# Patient Record
Sex: Male | Born: 1955 | Race: White | Hispanic: No | Smoking: Current every day smoker
Health system: Southern US, Community
[De-identification: ages and names within clinical notes are randomized; demographics above are authoritative.]

## PROBLEM LIST (undated history)

## (undated) DIAGNOSIS — E7800 Pure hypercholesterolemia, unspecified: Secondary | ICD-10-CM

## (undated) DIAGNOSIS — N189 Chronic kidney disease, unspecified: Secondary | ICD-10-CM

## (undated) DIAGNOSIS — N289 Disorder of kidney and ureter, unspecified: Secondary | ICD-10-CM

## (undated) DIAGNOSIS — I10 Essential (primary) hypertension: Secondary | ICD-10-CM

## (undated) DIAGNOSIS — E119 Type 2 diabetes mellitus without complications: Secondary | ICD-10-CM

## (undated) DIAGNOSIS — C801 Malignant (primary) neoplasm, unspecified: Secondary | ICD-10-CM

## (undated) HISTORY — PX: BIOPSY PHARYNX: SUR141

## (undated) HISTORY — PX: CATARACT EXTRACTION, BILATERAL: SHX1313

## (undated) HISTORY — PX: CHOLECYSTECTOMY: SHX55

---

## 2015-11-25 ENCOUNTER — Emergency Department (HOSPITAL_COMMUNITY)
Admission: EM | Admit: 2015-11-25 | Discharge: 2015-11-25 | Disposition: A | Discharge status: Home / Self Care | Payer: Medicaid Other | Attending: Emergency Medicine | Admitting: Emergency Medicine

## 2015-11-25 ENCOUNTER — Encounter (HOSPITAL_COMMUNITY): Payer: Self-pay | Admitting: Emergency Medicine

## 2015-11-25 ENCOUNTER — Emergency Department (HOSPITAL_COMMUNITY): Payer: Medicaid Other

## 2015-11-25 DIAGNOSIS — S161XXA Strain of muscle, fascia and tendon at neck level, initial encounter: Secondary | ICD-10-CM | POA: Insufficient documentation

## 2015-11-25 DIAGNOSIS — F17210 Nicotine dependence, cigarettes, uncomplicated: Secondary | ICD-10-CM | POA: Insufficient documentation

## 2015-11-25 DIAGNOSIS — Y999 Unspecified external cause status: Secondary | ICD-10-CM | POA: Diagnosis not present

## 2015-11-25 DIAGNOSIS — Z791 Long term (current) use of non-steroidal anti-inflammatories (NSAID): Secondary | ICD-10-CM | POA: Diagnosis not present

## 2015-11-25 DIAGNOSIS — Z79899 Other long term (current) drug therapy: Secondary | ICD-10-CM | POA: Insufficient documentation

## 2015-11-25 DIAGNOSIS — E119 Type 2 diabetes mellitus without complications: Secondary | ICD-10-CM | POA: Insufficient documentation

## 2015-11-25 DIAGNOSIS — I10 Essential (primary) hypertension: Secondary | ICD-10-CM | POA: Diagnosis not present

## 2015-11-25 DIAGNOSIS — S199XXA Unspecified injury of neck, initial encounter: Secondary | ICD-10-CM | POA: Diagnosis present

## 2015-11-25 DIAGNOSIS — Y92410 Unspecified street and highway as the place of occurrence of the external cause: Secondary | ICD-10-CM | POA: Diagnosis not present

## 2015-11-25 DIAGNOSIS — Y9389 Activity, other specified: Secondary | ICD-10-CM | POA: Diagnosis not present

## 2015-11-25 HISTORY — DX: Type 2 diabetes mellitus without complications: E11.9

## 2015-11-25 HISTORY — DX: Malignant (primary) neoplasm, unspecified: C80.1

## 2015-11-25 HISTORY — DX: Essential (primary) hypertension: I10

## 2015-11-25 MED ORDER — METHOCARBAMOL 500 MG PO TABS: 500.0000 mg | ORAL_TABLET | Freq: Three times a day (TID) | ORAL | 0 refills | Status: AC

## 2015-11-25 MED ORDER — IBUPROFEN 800 MG PO TABS
800.0000 mg | ORAL_TABLET | Freq: Once | ORAL | Status: AC
  Administered 2015-11-25: 800 mg via ORAL
  Filled 2015-11-25: qty 1

## 2015-11-25 MED ORDER — METHOCARBAMOL 500 MG PO TABS
500.0000 mg | ORAL_TABLET | Freq: Once | ORAL | Status: AC
  Administered 2015-11-25: 500 mg via ORAL
  Filled 2015-11-25: qty 1

## 2015-11-25 MED ORDER — IBUPROFEN 800 MG PO TABS: 800.0000 mg | ORAL_TABLET | Freq: Three times a day (TID) | ORAL | 0 refills | Status: DC

## 2015-11-25 NOTE — ED Notes (Signed)
C-collar applied

## 2015-11-25 NOTE — Discharge Instructions (Signed)
Apply ice packs on/off to your neck and back.  Follow-up with your primary doctor or one the orthopedic doctor listed if not improving
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
## 2015-12-29 ENCOUNTER — Other Ambulatory Visit (HOSPITAL_COMMUNITY): Payer: Self-pay | Admitting: Orthopaedic Surgery

## 2015-12-29 DIAGNOSIS — M542 Cervicalgia: Secondary | ICD-10-CM

## 2016-01-04 ENCOUNTER — Ambulatory Visit (HOSPITAL_COMMUNITY)
Admission: RE | Admit: 2016-01-04 | Discharge: 2016-01-04 | Disposition: A | Discharge status: Home / Self Care | Payer: Medicaid Other | Source: Ambulatory Visit | Attending: Orthopaedic Surgery | Admitting: Orthopaedic Surgery

## 2016-01-04 DIAGNOSIS — M542 Cervicalgia: Secondary | ICD-10-CM | POA: Diagnosis present

## 2016-01-04 DIAGNOSIS — M47892 Other spondylosis, cervical region: Secondary | ICD-10-CM | POA: Insufficient documentation

## 2016-01-04 DIAGNOSIS — J323 Chronic sphenoidal sinusitis: Secondary | ICD-10-CM | POA: Insufficient documentation

## 2016-01-04 DIAGNOSIS — M5033 Other cervical disc degeneration, cervicothoracic region: Secondary | ICD-10-CM | POA: Insufficient documentation

## 2017-01-08 ENCOUNTER — Emergency Department (HOSPITAL_COMMUNITY)
Admission: EM | Admit: 2017-01-08 | Discharge: 2017-01-08 | Disposition: A | Discharge status: Home / Self Care | Payer: Medicare Other | Attending: Emergency Medicine | Admitting: Emergency Medicine

## 2017-01-08 ENCOUNTER — Encounter (HOSPITAL_COMMUNITY): Payer: Self-pay | Admitting: Emergency Medicine

## 2017-01-08 DIAGNOSIS — Y929 Unspecified place or not applicable: Secondary | ICD-10-CM | POA: Insufficient documentation

## 2017-01-08 DIAGNOSIS — F17210 Nicotine dependence, cigarettes, uncomplicated: Secondary | ICD-10-CM | POA: Insufficient documentation

## 2017-01-08 DIAGNOSIS — Z79899 Other long term (current) drug therapy: Secondary | ICD-10-CM | POA: Insufficient documentation

## 2017-01-08 DIAGNOSIS — Y998 Other external cause status: Secondary | ICD-10-CM | POA: Diagnosis not present

## 2017-01-08 DIAGNOSIS — S65514A Laceration of blood vessel of right ring finger, initial encounter: Secondary | ICD-10-CM | POA: Diagnosis present

## 2017-01-08 DIAGNOSIS — Y280XXA Contact with sharp glass, undetermined intent, initial encounter: Secondary | ICD-10-CM | POA: Diagnosis not present

## 2017-01-08 DIAGNOSIS — S61216A Laceration without foreign body of right little finger without damage to nail, initial encounter: Secondary | ICD-10-CM | POA: Diagnosis not present

## 2017-01-08 DIAGNOSIS — Y9389 Activity, other specified: Secondary | ICD-10-CM | POA: Insufficient documentation

## 2017-01-08 MED ORDER — LIDOCAINE HCL (PF) 1 % IJ SOLN
INTRAMUSCULAR | Status: AC
  Administered 2017-01-08: 13:00:00
  Filled 2017-01-08: qty 5

## 2017-01-08 MED ORDER — POVIDONE-IODINE 10 % EX SOLN
CUTANEOUS | Status: AC
  Administered 2017-01-08: 13:00:00
  Filled 2017-01-08: qty 15

## 2017-01-08 MED ORDER — IBUPROFEN 800 MG PO TABS: 800.0000 mg | ORAL_TABLET | Freq: Three times a day (TID) | ORAL | 0 refills | Status: AC | PRN

## 2017-01-08 NOTE — ED Provider Notes (Signed)
[facility] DEPT Provider Note   CSN: [id number] Arrival date & time: 01/08/17  0931     History   Chief Complaint Chief Complaint  Patient presents with  . Extremity Laceration    HPI Larry Pittman is a 61 y.o. male.  HPI   61 year old male presenting for evaluation of finger laceration.earlier today, patient was reaching for a class and accidentally cut his right pinky finger on the broken edge. He reported immediate bleeding. Complaining of mild 4 out of 10 sharp non radiating pain to the affected site. He is right-hand dominant up-to-date with tetanus. He denies any other injury and denies any associated numbness. No specific treatment tried.  Past Medical History:  Diagnosis Date  . Cancer ([place])    throat     There are no active problems to display for this patient.   Past Surgical History:  Procedure Laterality Date  . BIOPSY PHARYNX         Home Medications    Prior to Admission medications   Medication Sig Start Date End Date Taking? Authorizing Provider  ibuprofen (ADVIL,MOTRIN) 800 MG tablet Take 1 tablet (800 mg total) by mouth 3 (three) times daily. 11/25/15   Triplett, Tammy, PA-C  methocarbamol (ROBAXIN) 500 MG tablet Take 1 tablet (500 mg total) by mouth 3 (three) times daily. 11/25/15   Kem Parkinson, PA-C    Family History History reviewed. No pertinent family history.  Social History Social History  Substance Use Topics  . Smoking status: Current Every Day Smoker    Packs/day: 0.50    Types: Cigarettes  . Smokeless tobacco: Never Used  . Alcohol use No     Allergies   Patient has no known allergies.   Review of Systems Review of Systems  Constitutional: Negative for fever.  Skin: Positive for wound.  Neurological: Negative for numbness.     Physical Exam Updated Vital Signs BP (!) 142/78 (BP Location: Left Arm)   Pulse 95   Temp 98.4 F (36.9 C) (Oral)   Resp 18   Ht 5\' 6"  (1.676 m)   Wt 85.7 kg (189 lb)   SpO2  94%   BMI 30.51 kg/m   Physical Exam  Constitutional: He appears well-developed and well-nourished. No distress.  HENT:  Head: Atraumatic.  Eyes: Conjunctivae are normal.  Neck: Neck supple.  Musculoskeletal:  Right hand: Fifth digit, 1 cm superficial laceration noted across the volar aspects of the PIP with normal flexion extension. Sensation intact distally, brisk bleeding noted. Brisk cap refill.  Neurological: He is alert.  Skin: No rash noted.  Psychiatric: He has a normal mood and affect.  Nursing note and vitals reviewed.    ED Treatments / Results  Labs (all labs ordered are listed, but only abnormal results are displayed) Labs Reviewed - No data to display  EKG  EKG Interpretation None       Radiology No results found.  Procedures .Marland KitchenLaceration Repair Date/Time: 01/08/2017 1:10 PM Performed by: Domenic Moras Authorized by: Domenic Moras   Consent:    Consent obtained:  Verbal   Consent given by:  Patient   Risks discussed:  Poor cosmetic result and infection   Alternatives discussed:  No treatment and delayed treatment Anesthesia (see MAR for exact dosages):    Anesthesia method:  Nerve block   Block location:  Digital nerve block   Block needle gauge:  25 G   Block anesthetic:  Lidocaine 1% w/o epi   Block technique:  Digital nerve block  Block injection procedure:  Anatomic landmarks identified   Block outcome:  Anesthesia achieved Laceration details:    Location:  Finger   Finger location:  R small finger   Length (cm):  1   Depth (mm):  3 Repair type:    Repair type:  Simple Pre-procedure details:    Preparation:  Patient was prepped and draped in usual sterile fashion Exploration:    Hemostasis achieved with:  Direct pressure   Wound exploration: wound explored through full range of motion and entire depth of wound probed and visualized     Wound extent: vascular damage     Wound extent: no foreign bodies/material noted and no muscle damage  noted     Contaminated: no   Skin repair:    Repair method:  Sutures   Suture size:  5-0   Suture material:  Prolene   Suture technique:  Simple interrupted   Number of sutures:  3 Approximation:    Approximation:  Close   Vermilion border: well-aligned   Post-procedure details:    Dressing:  Non-adherent dressing   Patient tolerance of procedure:  Tolerated well, no immediate complications    (including critical care time)  Medications Ordered in ED Medications  povidone-iodine (BETADINE) 10 % external solution (not administered)  lidocaine (PF) (XYLOCAINE) 1 % injection (not administered)     Initial Impression / Assessment and Plan / ED Course  I have reviewed the triage vital signs and the nursing notes.  Pertinent labs & imaging results that were available during my care of the patient were reviewed by me and considered in my medical decision making (see chart for details).    BP (!) 142/78 (BP Location: Left Arm)   Pulse 95   Temp 98.4 F (36.9 C) (Oral)   Resp 18   Ht 5\' 6"  (1.676 m)   Wt 85.7 kg (189 lb)   SpO2 94%   BMI 30.51 kg/m    Final Clinical Impressions(s) / ED Diagnoses   Final diagnoses:  Laceration without foreign body of right little finger without damage to nail, initial encounter    New Prescriptions Current Discharge Medication List     Successful lac repair. Recommend sutures removal in 7 days.  Return sooner if notice sign of infection.   Domenic Moras, PA-C 01/08/17 1317    Nat Christen, MD 01/11/17 1150

## 2017-01-08 NOTE — ED Triage Notes (Signed)
PT c/o right hand 5th digit laceration this am from a broken drinking glass. Pressure dressing applied at this time to help control bleeding.

## 2017-01-15 ENCOUNTER — Emergency Department (HOSPITAL_COMMUNITY)
Admission: EM | Admit: 2017-01-15 | Discharge: 2017-01-15 | Disposition: A | Discharge status: Home / Self Care | Payer: Medicare Other | Attending: Emergency Medicine | Admitting: Emergency Medicine

## 2017-01-15 ENCOUNTER — Encounter (HOSPITAL_COMMUNITY): Payer: Self-pay

## 2017-01-15 DIAGNOSIS — Z79899 Other long term (current) drug therapy: Secondary | ICD-10-CM | POA: Diagnosis not present

## 2017-01-15 DIAGNOSIS — F17210 Nicotine dependence, cigarettes, uncomplicated: Secondary | ICD-10-CM | POA: Diagnosis not present

## 2017-01-15 DIAGNOSIS — S61216D Laceration without foreign body of right little finger without damage to nail, subsequent encounter: Secondary | ICD-10-CM | POA: Insufficient documentation

## 2017-01-15 DIAGNOSIS — Y33XXXD Other specified events, undetermined intent, subsequent encounter: Secondary | ICD-10-CM | POA: Diagnosis not present

## 2017-01-15 DIAGNOSIS — Z4802 Encounter for removal of sutures: Secondary | ICD-10-CM

## 2017-01-15 NOTE — ED Triage Notes (Signed)
Pt here for suture removal from r little finger.  Denies any complaints.

## 2017-01-15 NOTE — ED Provider Notes (Signed)
[facility] DEPT Provider Note   CSN: [id number] Arrival date & time: 01/15/17  0630     History   Chief Complaint Chief Complaint  Patient presents with  . Suture / Staple Removal    HPI Larry Pittman is a 61 y.o. male.  The history is provided by the patient.  Suture / Staple Removal  The current episode started more than 2 days ago. The problem has been gradually improving. Pertinent negatives include no chest pain, no abdominal pain, no headaches and no shortness of breath. Nothing aggravates the symptoms. Nothing relieves the symptoms. He has tried acetaminophen for the symptoms. The treatment provided significant relief.    Past Medical History:  Diagnosis Date  . Cancer ([place])    throat     There are no active problems to display for this patient.   Past Surgical History:  Procedure Laterality Date  . BIOPSY PHARYNX         Home Medications    Prior to Admission medications   Medication Sig Start Date End Date Taking? Authorizing Provider  ibuprofen (ADVIL,MOTRIN) 800 MG tablet Take 1 tablet (800 mg total) by mouth every 8 (eight) hours as needed for mild pain or moderate pain. 01/08/17   Domenic Moras, PA-C  methocarbamol (ROBAXIN) 500 MG tablet Take 1 tablet (500 mg total) by mouth 3 (three) times daily. 11/25/15   Triplett, Lynelle Smoke, PA-C    Family History No family history on file.  Social History Social History  Substance Use Topics  . Smoking status: Current Every Day Smoker    Packs/day: 0.50    Types: Cigarettes  . Smokeless tobacco: Never Used  . Alcohol use No     Allergies   Patient has no known allergies.   Review of Systems Review of Systems  Constitutional: Negative for activity change.       All ROS Neg except as noted in HPI  HENT: Negative for nosebleeds.   Eyes: Negative for photophobia and discharge.  Respiratory: Negative for cough, shortness of breath and wheezing.   Cardiovascular: Negative for chest pain and  palpitations.  Gastrointestinal: Negative for abdominal pain and blood in stool.  Genitourinary: Negative for dysuria, frequency and hematuria.  Musculoskeletal: Negative for arthralgias, back pain and neck pain.  Skin: Negative.   Neurological: Negative for dizziness, seizures, speech difficulty and headaches.  Psychiatric/Behavioral: Negative for confusion and hallucinations.     Physical Exam Updated Vital Signs BP (!) 148/89 (BP Location: Left Arm)   Pulse 83   Temp 98.1 F (36.7 C) (Oral)   Resp 18   Ht 5\' 6"  (1.676 m)   Wt 85.7 kg (189 lb)   SpO2 97%   BMI 30.51 kg/m   Physical Exam  Constitutional: He is oriented to person, place, and time. He appears well-developed and well-nourished.  Non-toxic appearance.  HENT:  Head: Normocephalic.  Right Ear: Tympanic membrane and external ear normal.  Left Ear: Tympanic membrane and external ear normal.  Eyes: Pupils are equal, round, and reactive to light. EOM and lids are normal.  Neck: Normal range of motion. Neck supple. Carotid bruit is not present.  Cardiovascular: Normal rate, regular rhythm, normal heart sounds, intact distal pulses and normal pulses.   Pulmonary/Chest: Breath sounds normal. No respiratory distress.  Abdominal: Soft. Bowel sounds are normal. There is no tenderness. There is no guarding.  Musculoskeletal: Normal range of motion.  Sutures in the right little finger intact. No signs of infection.   Lymphadenopathy:  Head (right side): No submandibular adenopathy present.       Head (left side): No submandibular adenopathy present.    He has no cervical adenopathy.  Neurological: He is alert and oriented to person, place, and time. He has normal strength. No cranial nerve deficit or sensory deficit.  No sensory/motor deficit of the right upper extremity  Skin: Skin is warm and dry.  Psychiatric: He has a normal mood and affect. His speech is normal.  Nursing note and vitals reviewed.    ED  Treatments / Results  Labs (all labs ordered are listed, but only abnormal results are displayed) Labs Reviewed - No data to display  EKG  EKG Interpretation None       Radiology No results found.  Procedures Procedures (including critical care time) Sutures removed by nursing staff. Pt has good ROm. No significant bleeding. No sensory deficit. Pt tolerated procedure without problem. Medications Ordered in ED Medications - No data to display   Initial Impression / Assessment and Plan / ED Course  I have reviewed the triage vital signs and the nursing notes.  Pertinent labs & imaging results that were available during my care of the patient were reviewed by me and considered in my medical decision making (see chart for details).       Final Clinical Impressions(s) / ED Diagnoses MDM No reported problem with infection or drainage from the suture site. No fever or chills. Sutures removed without problem. Pt will return to the ED if any changes or complications.   Final diagnoses:  Visit for suture removal    New Prescriptions New Prescriptions   No medications on file     Lily Kocher, Hershal Coria 01/15/17 9892    Nat Christen, MD 01/15/17 [phone]

## 2017-01-15 NOTE — Discharge Instructions (Signed)
Please see your MD or return to the ED if any changes or concern for infection.

## 2017-09-07 ENCOUNTER — Encounter (HOSPITAL_COMMUNITY): Payer: Self-pay

## 2017-09-07 ENCOUNTER — Emergency Department (HOSPITAL_COMMUNITY)
Admission: EM | Admit: 2017-09-07 | Discharge: 2017-09-07 | Disposition: A | Discharge status: Home / Self Care | Payer: Medicare Other | Attending: Emergency Medicine | Admitting: Emergency Medicine

## 2017-09-07 DIAGNOSIS — F17210 Nicotine dependence, cigarettes, uncomplicated: Secondary | ICD-10-CM | POA: Diagnosis not present

## 2017-09-07 DIAGNOSIS — M79644 Pain in right finger(s): Secondary | ICD-10-CM | POA: Diagnosis present

## 2017-09-07 DIAGNOSIS — W57XXXA Bitten or stung by nonvenomous insect and other nonvenomous arthropods, initial encounter: Secondary | ICD-10-CM | POA: Insufficient documentation

## 2017-09-07 NOTE — ED Triage Notes (Signed)
Pt reports right thumb pain since last pm. Finger swollen. Unsure if he has something stuck in it. Also removed a tick from right side last night and now area is red and swollen

## 2017-09-07 NOTE — Discharge Instructions (Addendum)
Soak your thumb in warm water 2-3 times a day.  Clean the area out with peroxide once daily.  Return to the ER for any redness increased swelling or increased pain to your thumb.  You may apply 1% hydrocortisone cream to the tick bite area 3 times a day.  Over-the-counter Benadryl if needed for itching.  Watch for fever, joint pain, rashes, flulike symptoms or increasing redness to the site of the tick bite.  If you see any other symptoms, follow-up with your primary doctor or return to the emergency room.

## 2017-09-07 NOTE — ED Provider Notes (Signed)
[facility] EMERGENCY DEPARTMENT Provider Note   CSN: [id number] Arrival date & time: 09/07/17  0744     History   Chief Complaint Chief Complaint  Patient presents with  . Hand Pain  . Tick Removal    HPI Colson Barco is a 62 y.o. male.  HPI   Fredderick Swanger is a 62 y.o. male who presents to the Emergency Department complaining of tick bite to his right chest wall.  He states the tick has been present for 2 days.  He removed it last night.  He comes to the emergency room for evaluation stating the area is now red and swollen.  He cleaned the area with hydrogen peroxide.  He denies rash or bleeding.  No fever, arthralgias or flulike symptoms.  He also request evaluation of pain to his right thumb since last evening.  He states that he may have a splinter or puncture wound to his thumb.  Pain is associated with palpation and movement.  He denies known injury.  TD is up-to-date.   Past Medical History:  Diagnosis Date  . Cancer ([place])    throat     There are no active problems to display for this patient.   Past Surgical History:  Procedure Laterality Date  . BIOPSY PHARYNX        Home Medications    Prior to Admission medications   Medication Sig Start Date End Date Taking? Authorizing Provider  ibuprofen (ADVIL,MOTRIN) 800 MG tablet Take 1 tablet (800 mg total) by mouth every 8 (eight) hours as needed for mild pain or moderate pain. 01/08/17   Domenic Moras, PA-C  methocarbamol (ROBAXIN) 500 MG tablet Take 1 tablet (500 mg total) by mouth 3 (three) times daily. 11/25/15   Gertrue Willette, Lynelle Smoke, PA-C    Family History No family history on file.  Social History Social History   Tobacco Use  . Smoking status: Current Every Day Smoker    Packs/day: 0.50    Types: Cigarettes  . Smokeless tobacco: Never Used  Substance Use Topics  . Alcohol use: No  . Drug use: No     Allergies   Patient has no known allergies.   Review of Systems Review of Systems    Constitutional: Negative for chills and fever.  Gastrointestinal: Negative for nausea and vomiting.  Musculoskeletal: Negative for joint swelling and myalgias.  Skin: Positive for wound. Negative for color change.       Tick bite right chest puncture, wound right thumb   Neurological: Negative for dizziness, weakness and numbness.  All other systems reviewed and are negative.    Physical Exam Updated Vital Signs BP (!) 157/78 (BP Location: Left Arm)   Pulse 71   Temp 97.7 F (36.5 C) (Oral)   Resp 15   Ht 5\' 6"  (1.676 m)   Wt 78.9 kg (174 lb)   SpO2 100%   BMI 28.08 kg/m   Physical Exam  Constitutional: He is oriented to person, place, and time. He appears well-developed and well-nourished. No distress.  HENT:  Head: Atraumatic.  Neck: Normal range of motion.  Cardiovascular: Normal rate, regular rhythm and intact distal pulses.  Pulmonary/Chest: Effort normal and breath sounds normal. No respiratory distress.  Musculoskeletal: Normal range of motion.  Small puncture to the distal fat pad area of the right thumb.  No erythema or significant edema.  Patient has full range of motion   Neurological: He is alert and oriented to person, place, and time.  Skin: Skin  is warm. Capillary refill takes less than 2 seconds.  Scabbed erythematous papule to the lateral right chest wall.  No surrounding erythema.  No remaining tick parts seen.  Nursing note and vitals reviewed.    ED Treatments / Results  Labs (all labs ordered are listed, but only abnormal results are displayed) Labs Reviewed - No data to display  EKG None  Radiology No results found.  Procedures Procedures (including critical care time)   Distal right thumb was cleaned with alcohol pads and puncture wound explored using an 18-gauge needle.  No foreign bodies seen or removed.  Medications Ordered in ED Medications - No data to display   Initial Impression / Assessment and Plan / ED Course  I have  reviewed the triage vital signs and the nursing notes.  Pertinent labs & imaging results that were available during my care of the patient were reviewed by me and considered in my medical decision making (see chart for details).     Localized tick bite reaction to the lower right chest wall.  Patient does not have other symptoms.  States tick was not engorged.  Discussed signs and symptoms of tick related illness, patient verbalized understanding and agrees to follow-up with his PCP or return to the ER if symptoms develop.  Final Clinical Impressions(s) / ED Diagnoses   Final diagnoses:  Tick bite, initial encounter  Pain of right thumb    ED Discharge Orders    None       Kem Parkinson, PA-C 09/07/17 0840    Francine Graven, DO 09/07/17 1125

## 2018-05-27 IMAGING — MR MR CERVICAL SPINE W/O CM
4 of 6 series · 14 of 48 positions shown · non-contrast
Comparison: Radiographs from 11/25/2015

CLINICAL DATA: Neck pain extending bilaterally through the
shoulders.

EXAM:
MRI CERVICAL SPINE WITHOUT CONTRAST
TECHNIQUE: Multiplanar, multisequence MR imaging of the cervical spine was
performed. No intravenous contrast was administered.

[Series 3: FLAIR · sagittal · 3.0mm · 0.46mm/px · 3 of 13 slices shown]
[im 1/13]
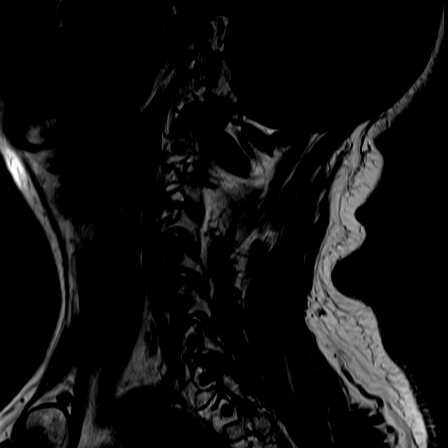
[im 7/13]
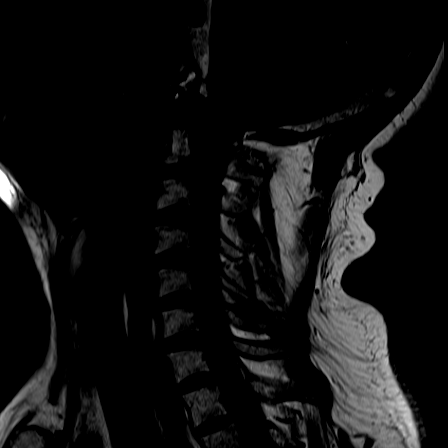
[im 13/13]
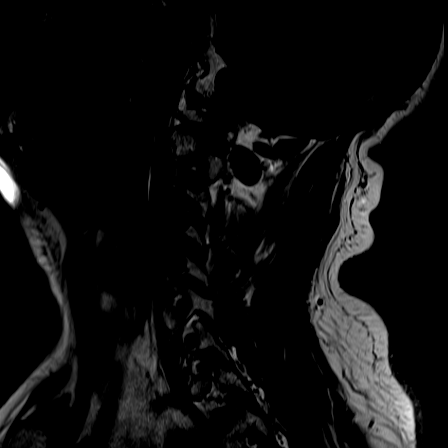

[Series 4: T2 · sagittal · 3.0mm · 0.43mm/px · 5 of 13 slices shown (1 of 2)]
[im 1/13]
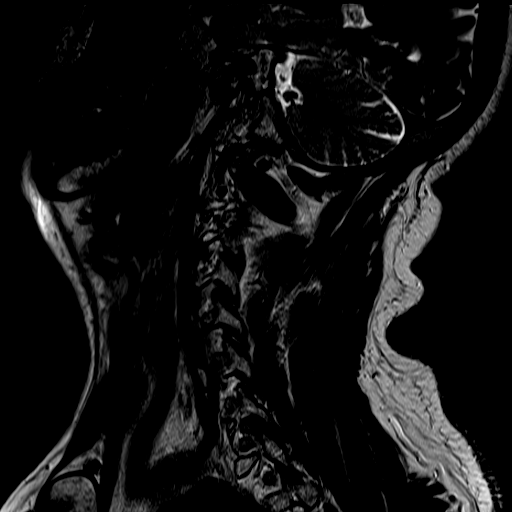
[im 4/13]
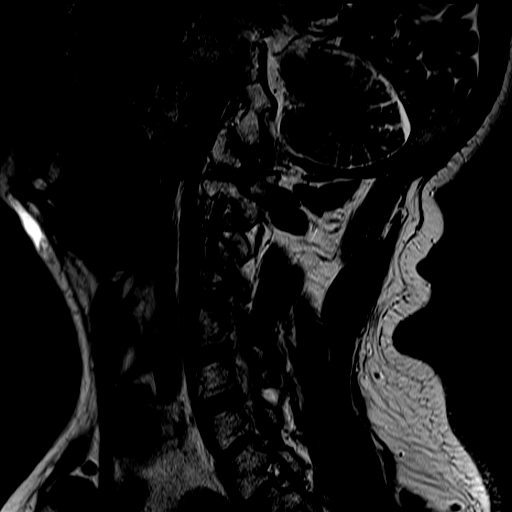
[im 7/13]
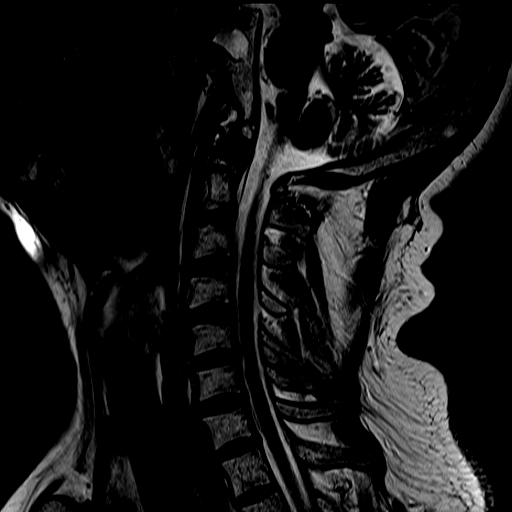
[im 10/13]
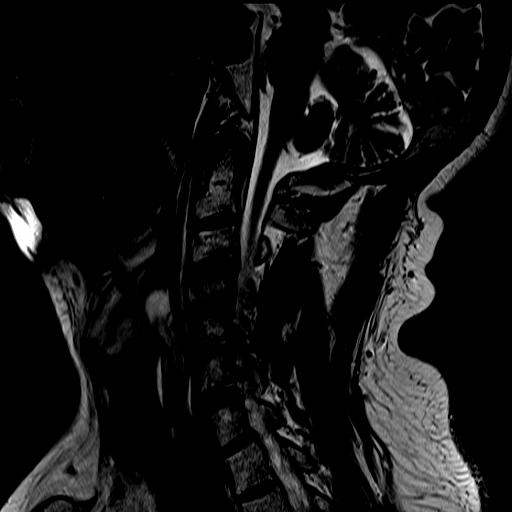
[im 13/13]
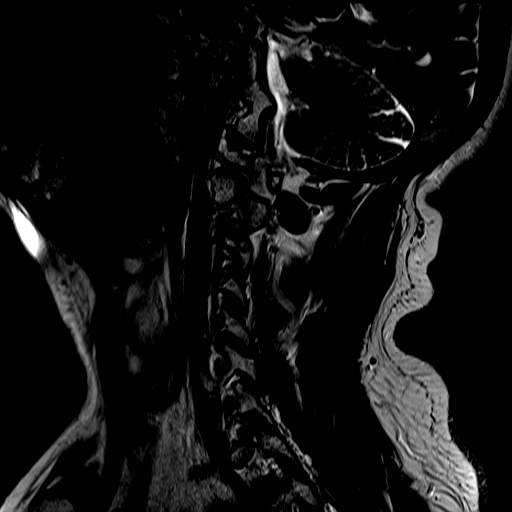

[Series 5: ir sagital · sagittal · 3.0mm · 0.25mm/px · 3 of 13 slices shown]
[im 1/13]
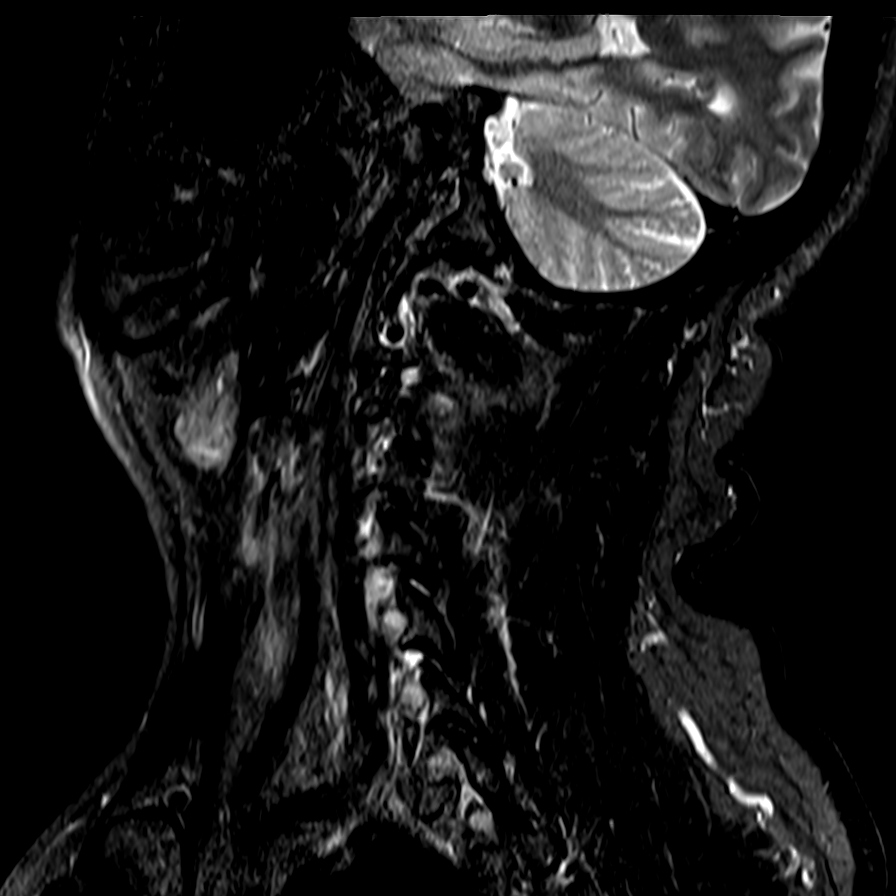
[im 7/13]
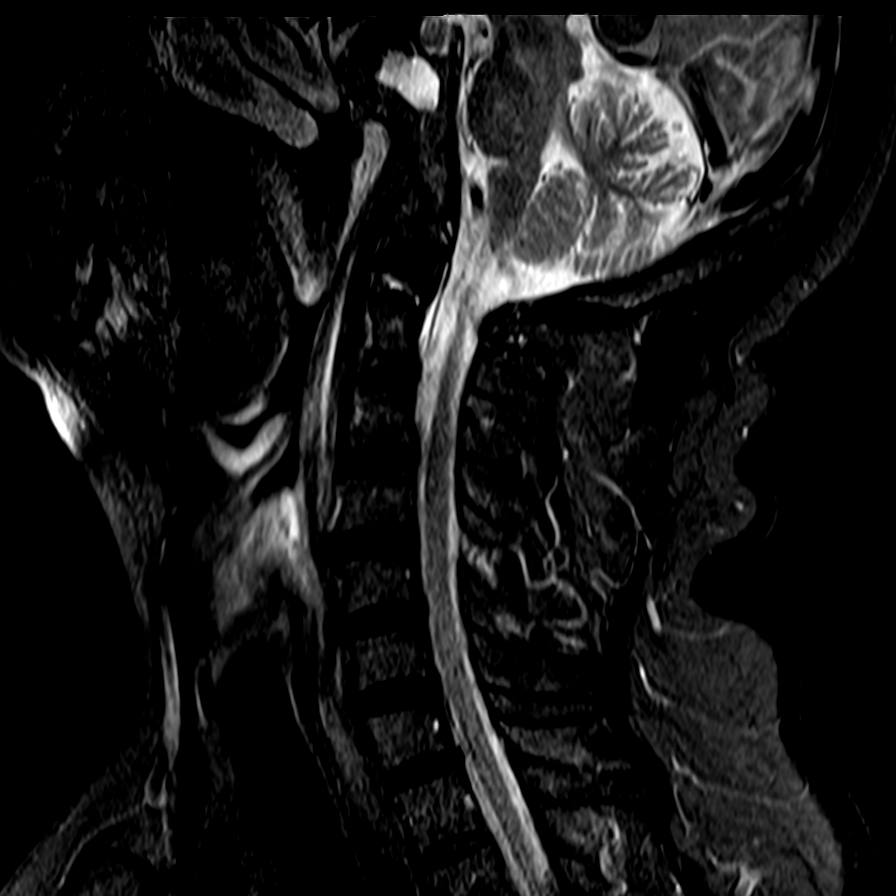
[im 13/13]
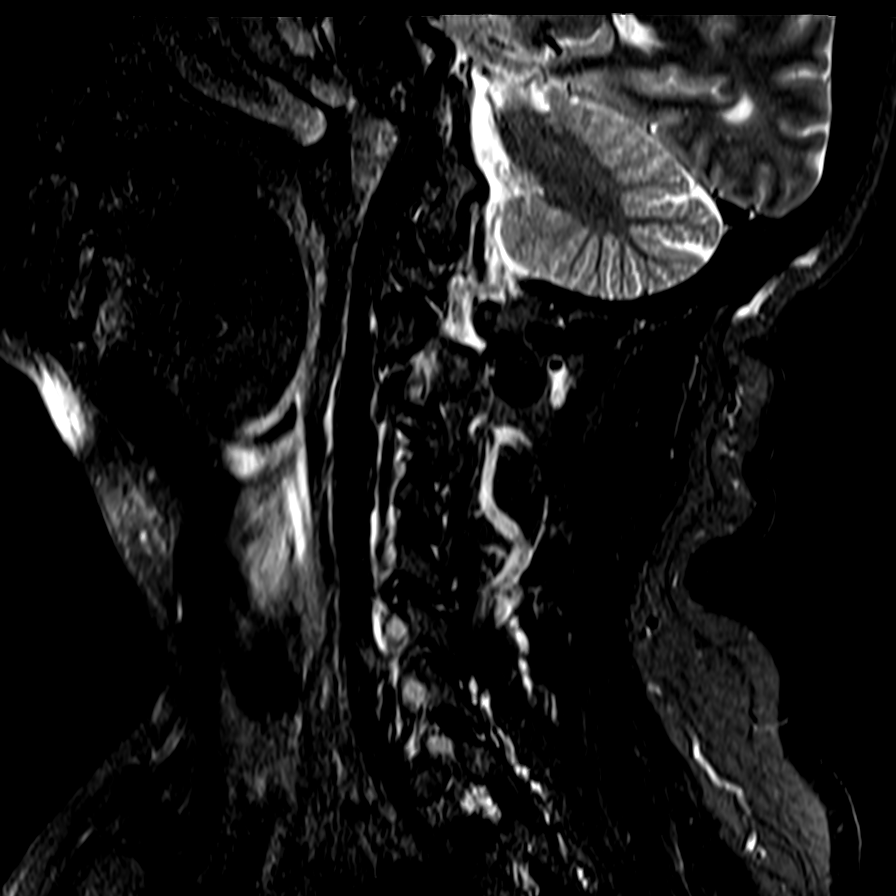

[Series 8: T2 · axial · 3.0mm · 0.17mm/px · z∈[-30,+57]mm · 3 of 40 slices shown (2 of 2)]
[im 7/40]
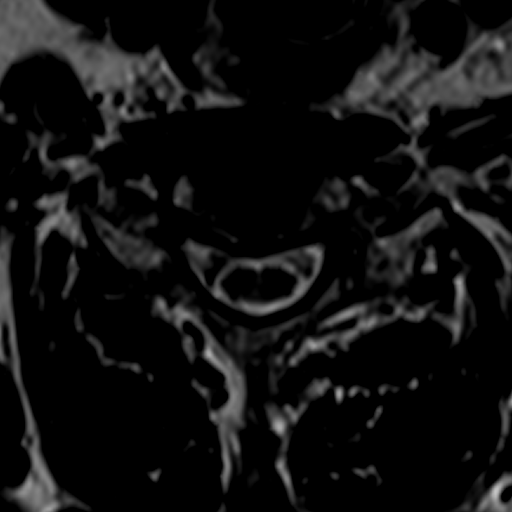
[im 22/40]
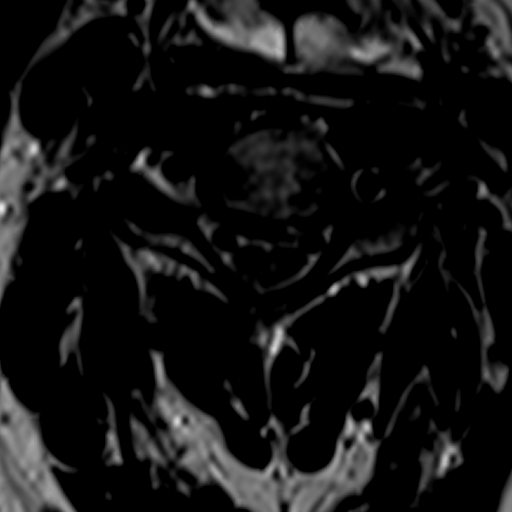
[im 34/40]
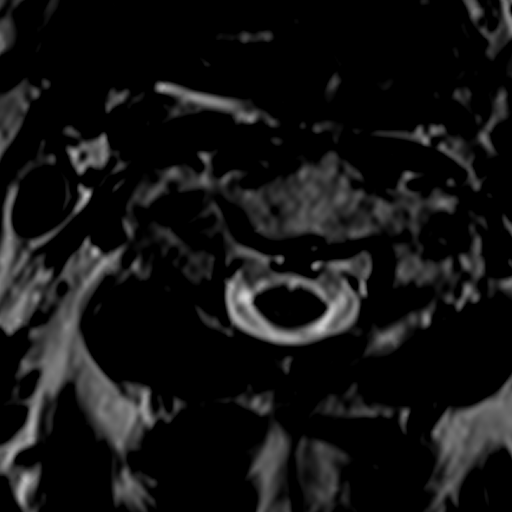

[14 of 48 positions shown; findings below may reference images not displayed]

FINDINGS: Alignment: No vertebral subluxation is observed.

Vertebrae: No significant vertebral marrow edema is identified. The
pedicles in the cervical spine are mildly congenitally short.

Cord: No significant abnormal spinal cord signal is observed.

Posterior Fossa, vertebral arteries, paraspinal tissues: Chronic
sphenoid sinusitis.

Disc levels:

C2-3:  Borderline left foraminal stenosis due to uncinate spurring.

C3-4: Moderate central narrowing of the thecal sac with moderate to
prominent left and mild right foraminal stenosis due to disc bulge,
uncinate spurring, and left foraminal disc protrusion along with
short pedicles.

C4-5: Moderate to prominent bilateral foraminal stenosis and
moderate central narrowing of the thecal sac due to disc bulge and
facet arthropathy along with short pedicles.

C5-6: Moderate left and mild right foraminal stenosis with moderate
central narrowing of the thecal sac due to disc bulge, short
pedicles, and facet arthropathy.

C6-7: Mild right and borderline left foraminal stenosis with mild
central narrowing of the thecal sac due to short pedicles, disc
bulge, and facet arthropathy.

C7-T1: Mild left and borderline right foraminal stenosis and
borderline central narrowing of the thecal sac due to disc bulge and
facet arthropathy.
IMPRESSION: 1. Cervical spondylosis, congenitally short pedicles, and
degenerative disc disease, causing moderate to prominent impingement
at C4-5 ; moderate impingement at C3-4 and C5-6 ; and mild
impingement at C6-7 and C7-T1, as detailed above.
2. Chronic sphenoid sinusitis.

## 2024-04-17 ENCOUNTER — Emergency Department (HOSPITAL_COMMUNITY)

## 2024-04-17 ENCOUNTER — Emergency Department (HOSPITAL_COMMUNITY)
Admission: EM | Admit: 2024-04-17 | Discharge: 2024-04-17 | Disposition: A | Discharge status: Home / Self Care | Attending: Emergency Medicine | Admitting: Emergency Medicine

## 2024-04-17 ENCOUNTER — Encounter (HOSPITAL_COMMUNITY): Payer: Self-pay

## 2024-04-17 DIAGNOSIS — I959 Hypotension, unspecified: Secondary | ICD-10-CM | POA: Diagnosis not present

## 2024-04-17 DIAGNOSIS — I951 Orthostatic hypotension: Secondary | ICD-10-CM

## 2024-04-17 DIAGNOSIS — R42 Dizziness and giddiness: Secondary | ICD-10-CM | POA: Diagnosis present

## 2024-04-17 HISTORY — DX: Pure hypercholesterolemia, unspecified: E78.00

## 2024-04-17 HISTORY — DX: Disorder of kidney and ureter, unspecified: N28.9

## 2024-04-17 HISTORY — DX: Chronic kidney disease, unspecified: N18.9

## 2024-04-17 LAB — COMPREHENSIVE METABOLIC PANEL WITH GFR
ALT: 13 U/L (ref 0–44)
AST: 17 U/L (ref 15–41)
Albumin: 4.5 g/dL (ref 3.5–5.0)
Alkaline Phosphatase: 41 U/L (ref 38–126)
Anion gap: 15 (ref 5–15)
BUN: 20 mg/dL (ref 8–23)
CO2: 22 mmol/L (ref 22–32)
Calcium: 8.7 mg/dL — ABNORMAL LOW (ref 8.9–10.3)
Chloride: 101 mmol/L (ref 98–111)
Creatinine, Ser: 1.31 mg/dL — ABNORMAL HIGH (ref 0.61–1.24)
GFR, Estimated: 59 mL/min — ABNORMAL LOW
Glucose, Bld: 140 mg/dL — ABNORMAL HIGH (ref 70–99)
Potassium: 4.6 mmol/L (ref 3.5–5.1)
Sodium: 138 mmol/L (ref 135–145)
Total Bilirubin: 0.5 mg/dL (ref 0.0–1.2)
Total Protein: 6.8 g/dL (ref 6.5–8.1)

## 2024-04-17 LAB — CBC WITH DIFFERENTIAL/PLATELET
Abs Immature Granulocytes: 0.01 K/uL (ref 0.00–0.07)
Basophils Absolute: 0 K/uL (ref 0.0–0.1)
Basophils Relative: 1 %
Eosinophils Absolute: 0.2 K/uL (ref 0.0–0.5)
Eosinophils Relative: 3 %
HCT: 44.3 % (ref 39.0–52.0)
Hemoglobin: 14.6 g/dL (ref 13.0–17.0)
Immature Granulocytes: 0 %
Lymphocytes Relative: 23 %
Lymphs Abs: 1.6 K/uL (ref 0.7–4.0)
MCH: 30.2 pg (ref 26.0–34.0)
MCHC: 33 g/dL (ref 30.0–36.0)
MCV: 91.5 fL (ref 80.0–100.0)
Monocytes Absolute: 0.7 K/uL (ref 0.1–1.0)
Monocytes Relative: 10 %
Neutro Abs: 4.3 K/uL (ref 1.7–7.7)
Neutrophils Relative %: 63 %
Platelets: 214 K/uL (ref 150–400)
RBC: 4.84 MIL/uL (ref 4.22–5.81)
RDW: 14 % (ref 11.5–15.5)
WBC: 6.8 K/uL (ref 4.0–10.5)
nRBC: 0 % (ref 0.0–0.2)

## 2024-04-17 LAB — URINALYSIS, ROUTINE W REFLEX MICROSCOPIC
Bilirubin Urine: NEGATIVE
Glucose, UA: NEGATIVE mg/dL
Hgb urine dipstick: NEGATIVE
Ketones, ur: NEGATIVE mg/dL
Leukocytes,Ua: NEGATIVE
Nitrite: NEGATIVE
Protein, ur: NEGATIVE mg/dL
Specific Gravity, Urine: 1.006 (ref 1.005–1.030)
pH: 5 (ref 5.0–8.0)

## 2024-04-17 LAB — D-DIMER, QUANTITATIVE: D-Dimer, Quant: 0.48 ug{FEU}/mL (ref 0.00–0.50)

## 2024-04-17 LAB — TROPONIN T, HIGH SENSITIVITY: Troponin T High Sensitivity: 16 ng/L (ref 0–19)

## 2024-04-17 MED ORDER — SODIUM CHLORIDE 0.9 % IV BOLUS
1000.0000 mL | Freq: Once | INTRAVENOUS | Status: AC
  Administered 2024-04-17: 1000 mL via INTRAVENOUS

## 2024-04-17 NOTE — ED Provider Notes (Signed)
 " [place] [facility]ENC[facility] AT [facility] Provider Note   CSN: [id number] Arrival date & time: 04/17/24  1254     Patient presents with: Hypotension   Larry Pittman is a 69 y.o. male.   Patient is a 69 year old male who presents to the emergency department with a chief complaint of low blood pressures and dizziness with standing which has been ongoing for approximate the past month but has become worse over the past week.  Patient is currently on 2 different blood pressure medications and does note that his PCP recently went up on his amlodipine to 10 mg but notes that he has only been taking 5 mg.  Patient does note that he has recently improved his diet.  He notes that he has had some headache today but denies any chest pain, shortness of breath or palpitations.  He denies any syncopal event.  Patient denies any associate abdominal pain, nausea, vomiting, diarrhea.  There has been no decrease in p.o. intake.        Prior to Admission medications  Medication Sig Start Date End Date Taking? Authorizing Provider  amLODipine (NORVASC) 10 MG tablet Take 5 mg by mouth daily.   Yes [provider]  atorvastatin (LIPITOR) 20 MG tablet Take 5 mg by mouth at bedtime.   Yes [provider]  metFORMIN (GLUCOPHAGE) 500 MG tablet Take 500 mg by mouth 2 (two) times daily.   Yes [provider]  telmisartan (MICARDIS) 20 MG tablet Take 20 mg by mouth daily.   Yes [provider]  ibuprofen  (ADVIL ,MOTRIN ) 800 MG tablet Take 1 tablet (800 mg total) by mouth every 8 (eight) hours as needed for mild pain or moderate pain. 01/08/17   Nivia Colon, PA-C  methocarbamol  (ROBAXIN ) 500 MG tablet Take 1 tablet (500 mg total) by mouth 3 (three) times daily. 11/25/15   Triplett, Tammy, PA-C    Allergies: Patient has no known allergies.    Review of Systems  Neurological:  Positive for dizziness.  All other systems reviewed and are negative.   Updated  Vital Signs BP 125/67 (BP Location: Right Arm)   Pulse 80   Temp 97.9 F (36.6 C)   Resp 16   Ht 5' 6 (1.676 m)   Wt 78.9 kg   SpO2 96%   BMI 28.08 kg/m   Physical Exam Vitals and nursing note reviewed.  Constitutional:      General: He is not in acute distress.    Appearance: Normal appearance. He is not ill-appearing.  HENT:     Head: Normocephalic and atraumatic.     Nose: Nose normal.     Mouth/Throat:     Mouth: Mucous membranes are moist.  Eyes:     Extraocular Movements: Extraocular movements intact.     Conjunctiva/sclera: Conjunctivae normal.     Pupils: Pupils are equal, round, and reactive to light.  Cardiovascular:     Rate and Rhythm: Normal rate and regular rhythm.     Pulses: Normal pulses.     Heart sounds: Normal heart sounds. No murmur heard.    No gallop.  Pulmonary:     Effort: Pulmonary effort is normal. No respiratory distress.     Breath sounds: Normal breath sounds. No stridor. No wheezing, rhonchi or rales.  Abdominal:     General: Abdomen is flat. Bowel sounds are normal. There is no distension.     Palpations: Abdomen is soft.     Tenderness: There is no abdominal  tenderness. There is no guarding.  Musculoskeletal:        General: Normal range of motion.     Cervical back: Normal range of motion and neck supple. No rigidity or tenderness.     Right lower leg: No edema.     Left lower leg: No edema.  Skin:    General: Skin is warm and dry.     Findings: No bruising or rash.  Neurological:     General: No focal deficit present.     Mental Status: He is alert and oriented to person, place, and time. Mental status is at baseline.     Cranial Nerves: No cranial nerve deficit.     Sensory: No sensory deficit.     Motor: No weakness.     Coordination: Coordination normal.     Gait: Gait normal.  Psychiatric:        Mood and Affect: Mood normal.        Behavior: Behavior normal.        Thought Content: Thought content normal.         Judgment: Judgment normal.     (all labs ordered are listed, but only abnormal results are displayed) Labs Reviewed  CBC WITH DIFFERENTIAL/PLATELET  COMPREHENSIVE METABOLIC PANEL WITH GFR  URINALYSIS, ROUTINE W REFLEX MICROSCOPIC  TROPONIN T, HIGH SENSITIVITY    EKG: None  Radiology: No results found.   Procedures   Medications Ordered in the ED  sodium chloride  0.9 % bolus 1,000 mL (has no administration in time range)                                    Medical Decision Making Amount and/or Complexity of Data Reviewed Labs: ordered. Radiology: ordered.   This patient presents to the ED for concern of hypotension differential diagnosis includes orthostatic hypotension, dehydration, electrolyte derangement, ACS, pulmonary embolus, CVA, TIA    Additional history obtained:  Additional history obtained from medical records External records from outside source obtained and reviewed including medical records   Lab Tests:  I Ordered, and personally interpreted labs.  The pertinent results include: No leukocytosis, no anemia, mild elevation in creatinine, unremarkable electrolytes, normal liver function, unremarkable urinalysis, negative D-dimer, negative troponin   Imaging Studies ordered:  I ordered imaging studies including CT scan head, chest x-ray I independently visualized and interpreted imaging which showed no acute intracranial process, no acute cardiopulmonary process I agree with the radiologist interpretation   Medicines ordered and prescription drug management:  I ordered medication including IV fluids for orthostatic hypotension Reevaluation of the patient after these medicines showed that the patient improved I have reviewed the patients home medicines and have made adjustments as needed   Problem List / ED Course:  Patient is doing much better at this time and is stable for discharge home.  Orthostasis has resolved.  Do suspect that symptoms  are secondary to overmedication.  He was directed to stop taking his amlodipine and follow-up closely with his primary care doctor for reevaluation.  Patient had no acute findings noted on blood work at this time.  Low suspicion for ACS.  CT scan of the head was unremarkable as well.  Do not suspect that admission is warranted at this time.  Close follow-up with PCP was discussed as well as strict turn precautions for any new or worsening symptoms.  Patient voiced understanding and had no additional questions.  Social Determinants of Health:  None        Final diagnoses:  None    ED Discharge Orders     None          Daralene Lonni BIRCH, [place] 04/17/24 1735  "

## 2024-04-17 NOTE — ED Notes (Addendum)
 Orthostatic v/s in triage  Sitting 125/67    Standing 97/58

## 2024-04-17 NOTE — Discharge Instructions (Signed)
 Please stop taking your amlodipine.  You may take your other medications.  Follow-up closely with your primary care doctor for reevaluation.  Return to emergency department immediately for any new or worsening symptoms.

## 2024-04-17 NOTE — ED Triage Notes (Signed)
 Pt c/o hypotension and dizziness w/ standing x1 week.  Pt denies recent medication changes.  Pt is concerned he is getting incorrect readings on his BP machine as well.    Pt had orthostatic changes in triage.    Pt reports lowest reading was either 102 or 105.
